# Patient Record
Sex: Male | Born: 1995 | Race: Black or African American | Hispanic: No | Marital: Single | Smoking: Current some day smoker
Health system: Southern US, Community
[De-identification: ages and names within clinical notes are randomized; demographics above are authoritative.]

## PROBLEM LIST (undated history)

## (undated) HISTORY — PX: TYMPANOSTOMY TUBE PLACEMENT: SHX32

---
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
## 2016-10-02 ENCOUNTER — Encounter (HOSPITAL_COMMUNITY): Payer: Self-pay | Admitting: Emergency Medicine

## 2016-10-02 ENCOUNTER — Emergency Department (HOSPITAL_COMMUNITY)
Admission: EM | Admit: 2016-10-02 | Discharge: 2016-10-02 | Disposition: A | Discharge status: Home / Self Care | Payer: BLUE CROSS/BLUE SHIELD | Attending: Emergency Medicine | Admitting: Emergency Medicine

## 2016-10-02 DIAGNOSIS — F17210 Nicotine dependence, cigarettes, uncomplicated: Secondary | ICD-10-CM | POA: Diagnosis not present

## 2016-10-02 DIAGNOSIS — Z79899 Other long term (current) drug therapy: Secondary | ICD-10-CM | POA: Diagnosis not present

## 2016-10-02 DIAGNOSIS — J029 Acute pharyngitis, unspecified: Secondary | ICD-10-CM | POA: Diagnosis not present

## 2016-10-02 LAB — BASIC METABOLIC PANEL
Anion gap: 11 (ref 5–15)
BUN: 11 mg/dL (ref 6–20)
CALCIUM: 9 mg/dL (ref 8.9–10.3)
CHLORIDE: 97 mmol/L — AB (ref 101–111)
CO2: 22 mmol/L (ref 22–32)
CREATININE: 1.16 mg/dL (ref 0.61–1.24)
GFR calc Af Amer: >60 mL/min (ref >60)
GFR calc non Af Amer: >60 mL/min (ref >60)
GLUCOSE: 124 mg/dL — AB (ref 65–99)
Potassium: 3 mmol/L — ABNORMAL LOW (ref 3.5–5.1)
Sodium: 130 mmol/L — ABNORMAL LOW (ref 135–145)

## 2016-10-02 LAB — CBC WITH DIFFERENTIAL/PLATELET
Basophils Absolute: 0 10*3/uL (ref 0.0–0.1)
Basophils Relative: 0 %
Eosinophils Absolute: 0 10*3/uL (ref 0.0–0.7)
Eosinophils Relative: 0 %
HEMATOCRIT: 40.2 % (ref 39.0–52.0)
HEMOGLOBIN: 14.3 g/dL (ref 13.0–17.0)
LYMPHS ABS: 1 10*3/uL (ref 0.7–4.0)
LYMPHS PCT: 9 %
MCH: 31.6 pg (ref 26.0–34.0)
MCHC: 35.6 g/dL (ref 30.0–36.0)
MCV: 88.9 fL (ref 78.0–100.0)
MONO ABS: 1.6 10*3/uL — AB (ref 0.1–1.0)
MONOS PCT: 14 %
NEUTROS ABS: 8.6 10*3/uL — AB (ref 1.7–7.7)
NEUTROS PCT: 77 %
Platelets: 138 10*3/uL — ABNORMAL LOW (ref 150–400)
RBC: 4.52 MIL/uL (ref 4.22–5.81)
RDW: 11.6 % (ref 11.5–15.5)
WBC: 11.2 10*3/uL — ABNORMAL HIGH (ref 4.0–10.5)

## 2016-10-02 LAB — MONONUCLEOSIS SCREEN: Mono Screen: NEGATIVE

## 2016-10-02 LAB — RAPID STREP SCREEN (MED CTR MEBANE ONLY): STREPTOCOCCUS, GROUP A SCREEN (DIRECT): NEGATIVE

## 2016-10-02 MED ORDER — IBUPROFEN 800 MG PO TABS
800.0000 mg | ORAL_TABLET | Freq: Once | ORAL | Status: AC
  Administered 2016-10-02: 800 mg via ORAL
  Filled 2016-10-02: qty 1

## 2016-10-02 MED ORDER — POTASSIUM CHLORIDE CRYS ER 20 MEQ PO TBCR
40.0000 meq | EXTENDED_RELEASE_TABLET | Freq: Once | ORAL | Status: AC
  Administered 2016-10-02: 40 meq via ORAL
  Filled 2016-10-02: qty 2

## 2016-10-02 MED ORDER — SODIUM CHLORIDE 0.9 % IV BOLUS (SEPSIS)
1000.0000 mL | Freq: Once | INTRAVENOUS | Status: DC
Start: 2016-10-02 — End: 2016-10-02

## 2016-10-02 MED ORDER — IBUPROFEN 800 MG PO TABS: 800.0000 mg | ORAL_TABLET | Freq: Three times a day (TID) | ORAL | 0 refills | Status: DC

## 2016-10-02 MED ORDER — ACETAMINOPHEN 500 MG PO TABS
1000.0000 mg | ORAL_TABLET | Freq: Once | ORAL | Status: AC
  Administered 2016-10-02: 1000 mg via ORAL
  Filled 2016-10-02: qty 2

## 2016-10-02 NOTE — Discharge Instructions (Signed)
Your sore throat appears viral at this time,  although a strep culture is pending and you will be notified if this test is positive. Rest and make sure you are drinking plenty of fluids. Continue taking ibuprofen which will help with your throat pain and your fevers.

## 2016-10-02 NOTE — ED Triage Notes (Signed)
Pt c/o sore throat, chills, n/v since Sunday.

## 2016-10-02 NOTE — ED Provider Notes (Signed)
[facility]-EMERGENCY DEPT Provider Note   CSN: [id number] Arrival date & time: 10/02/16  4098     History   Chief Complaint Chief Complaint  Patient presents with  . Sore Throat    HPI Norman Grant is a 20 y.o. male presenting with a 2 day history of sore throat, swollen throat glands,myalgia, nausea and subjective fevers.  He has taken DayQuil, NyQuil and Tylenol with temporary improvement his symptoms.  He also endorses a nonproductive cough.  He denies headache, chest pain, shortness of breath vomiting or abdominal pain.  He is currently a Consulting civil engineer, to his knowledge he has had no contact with strep throat or influenza.  He has been maintaining by mouth fluid intake and denies dizziness or weakness.  His last dose of medication was 2 aspirin tablets around 4 AM today.  The history is provided by the patient.    History reviewed. No pertinent past medical history.  Patient Active Problem List   Diagnosis Date Noted  . Metacarpophalangeal joint sprain 09/16/2012    Past Surgical History:  Procedure Laterality Date  . TYMPANOSTOMY TUBE PLACEMENT         Home Medications    Prior to Admission medications   Medication Sig Start Date End Date Taking? Authorizing Provider  Pseudoeph-Doxylamine-DM-APAP (NYQUIL PO) Take 1 capsule by mouth daily as needed (cold symptoms).   Yes Historical Provider, MD  Pseudoephedrine-APAP-DM (DAYQUIL PO) Take 1 capsule by mouth daily as needed (cold symptoms).   Yes Historical Provider, MD  ibuprofen (ADVIL,MOTRIN) 800 MG tablet Take 1 tablet (800 mg total) by mouth 3 (three) times daily. 10/02/16   Burgess Amor, PA-C    Family History No family history on file.  Social History Social History  Substance Use Topics  . Smoking status: Current Every Day Smoker    Types: Cigars  . Smokeless tobacco: Never Used  . Alcohol use No     Allergies   Review of patient's allergies indicates no known allergies.   Review of Systems Review of Systems   Constitutional: Positive for chills and fever.  HENT: Positive for sore throat. Negative for congestion, ear pain, rhinorrhea, sinus pressure, trouble swallowing and voice change.   Eyes: Negative for discharge.  Respiratory: Positive for cough. Negative for shortness of breath, wheezing and stridor.   Cardiovascular: Negative for chest pain.  Gastrointestinal: Positive for nausea. Negative for abdominal pain and vomiting.  Genitourinary: Negative.   Musculoskeletal: Positive for myalgias.     Physical Exam Updated Vital Signs BP 108/58 (BP Location: Left Arm)   Pulse 104   Temp 100.4 F (38 C) (Oral)   Resp 18   Ht 6\' 1"  (1.854 m)   Wt 90.7 kg   SpO2 99%   BMI 26.39 kg/m   Physical Exam  Constitutional: He is oriented to person, place, and time. He appears well-developed and well-nourished.  HENT:  Head: Normocephalic and atraumatic.  Right Ear: Tympanic membrane and ear canal normal.  Left Ear: Tympanic membrane and ear canal normal.  Nose: No mucosal edema or rhinorrhea.  Mouth/Throat: Uvula is midline and mucous membranes are normal. Posterior oropharyngeal erythema present. No oropharyngeal exudate, posterior oropharyngeal edema or tonsillar abscesses.  Eyes: Conjunctivae are normal.  Cardiovascular: Normal rate and normal heart sounds.   Pulmonary/Chest: Effort normal. No respiratory distress. He has no wheezes. He has no rales.  Abdominal: Soft. He exhibits no mass. There is no tenderness. There is no guarding.  Musculoskeletal: Normal range of motion.  Lymphadenopathy:  Head (right side): Tonsillar and occipital adenopathy present.       Head (left side): Tonsillar and occipital adenopathy present.  Neurological: He is alert and oriented to person, place, and time.  Skin: Skin is warm and dry. No rash noted.  Psychiatric: He has a normal mood and affect.     ED Treatments / Results  Labs (all labs ordered are listed, but only abnormal results are  displayed) Labs Reviewed  CBC WITH DIFFERENTIAL/PLATELET - Abnormal; Notable for the following:       Result Value   WBC 11.2 (*)    Platelets 138 (*)    Neutro Abs 8.6 (*)    Monocytes Absolute 1.6 (*)    All other components within normal limits  BASIC METABOLIC PANEL - Abnormal; Notable for the following:    Sodium 130 (*)    Potassium 3.0 (*)    Chloride 97 (*)    Glucose, Bld 124 (*)    All other components within normal limits  RAPID STREP SCREEN (NOT AT [facility])  CULTURE, GROUP A STREP [facility]  MONONUCLEOSIS SCREEN    EKG  EKG Interpretation None       Radiology No results found.  Procedures Procedures (including critical care time)  Medications Ordered in ED Medications  potassium chloride SA (K-DUR,KLOR-CON) CR tablet 40 mEq (not administered)  ibuprofen (ADVIL,MOTRIN) tablet 800 mg (800 mg Oral Given 10/02/16 0932)  acetaminophen (TYLENOL) tablet 1,000 mg (1,000 mg Oral Given 10/02/16 1050)     Initial Impression / Assessment and Plan / ED Course  I have reviewed the triage vital signs and the nursing notes.  Pertinent labs & imaging results that were available during my care of the patient were reviewed by me and considered in my medical decision making (see chart for details).  Clinical Course    Exam and labs c/w viral process. Pt felt much improved after receiving ibuprofen, fever and throat pain better.  He tolerated PO intake here without problems.  Encouraged continued ibuprofen , rest, increased fluid intake.  Recheck for any persistent or worsened sx.    Final Clinical Impressions(s) / ED Diagnoses   Final diagnoses:  Viral pharyngitis    New Prescriptions New Prescriptions   IBUPROFEN (ADVIL,MOTRIN) 800 MG TABLET    Take 1 tablet (800 mg total) by mouth 3 (three) times daily.     Burgess AmorJulie Deen Deguia, PA-C 10/02/16 1145    Azalia BilisKevin Campos, MD 10/02/16 [phone]

## 2016-10-02 NOTE — ED Notes (Signed)
Ginger ale given. Patient drinking without difficulty.

## 2016-10-04 LAB — CULTURE, GROUP A STREP (THRC)

## 2018-07-25 ENCOUNTER — Telehealth: Payer: Self-pay

## 2018-07-25 NOTE — Telephone Encounter (Signed)
Opened in error

## 2018-08-31 ENCOUNTER — Emergency Department (HOSPITAL_COMMUNITY)
Admission: EM | Admit: 2018-08-31 | Discharge: 2018-08-31 | Disposition: A | Discharge status: Home / Self Care | Payer: Self-pay | Attending: Emergency Medicine | Admitting: Emergency Medicine

## 2018-08-31 ENCOUNTER — Emergency Department (HOSPITAL_COMMUNITY): Payer: Self-pay

## 2018-08-31 ENCOUNTER — Other Ambulatory Visit: Payer: Self-pay

## 2018-08-31 ENCOUNTER — Encounter (HOSPITAL_COMMUNITY): Payer: Self-pay

## 2018-08-31 DIAGNOSIS — B9789 Other viral agents as the cause of diseases classified elsewhere: Secondary | ICD-10-CM | POA: Insufficient documentation

## 2018-08-31 DIAGNOSIS — R0789 Other chest pain: Secondary | ICD-10-CM

## 2018-08-31 DIAGNOSIS — J069 Acute upper respiratory infection, unspecified: Secondary | ICD-10-CM | POA: Insufficient documentation

## 2018-08-31 DIAGNOSIS — F17290 Nicotine dependence, other tobacco product, uncomplicated: Secondary | ICD-10-CM | POA: Insufficient documentation

## 2018-08-31 MED ORDER — PHENOL 1.4 % MT LIQD: 2.0000 | OROMUCOSAL | 0 refills | Status: AC | PRN

## 2018-08-31 MED ORDER — LORATADINE 10 MG PO TABS: 10.0000 mg | ORAL_TABLET | Freq: Every day | ORAL | 0 refills | Status: AC | PRN

## 2018-08-31 MED ORDER — FLUTICASONE PROPIONATE 50 MCG/ACT NA SUSP: 2.0000 | Freq: Every day | NASAL | 0 refills | Status: AC

## 2018-08-31 NOTE — Discharge Instructions (Addendum)
Your x-rays today did not show any sign of pneumonia.  Your symptoms are likely due to a virus.  Drink plenty water and get plenty of rest. Gargle warm salt water and spit it out for sore throat. May also use cough drops, warm teas, Chloraseptic spray over-the-counter, etc. Take flonase to decrease nasal congestion.  Over-the-counter allergy medicines or cold medicine for nasal congestion and scratchy throat. Alternate 600 mg of ibuprofen and [phone] mg of Tylenol every 3 hours as needed for pain. Do not exceed 4000 mg of Tylenol daily.  You can also apply heating pad or take a hot shower to help relax aching muscles.   Followup with your primary care doctor in 5-7 days for recheck of ongoing symptoms. Return to emergency department for emergent changing or worsening of symptoms such as throat tightness, facial swelling, fever not controlled by ibuprofen or Tylenol,difficulty breathing, or chest pain.

## 2018-08-31 NOTE — ED Triage Notes (Signed)
Pt reports sore throat, nasal congestion, cough, and body aches since Saturday morning.

## 2018-08-31 NOTE — ED Provider Notes (Signed)
[facility]-EMERGENCY DEPT Provider Note   CSN: [id number] Arrival date & time: 08/31/18  1634     History   Chief Complaint Chief Complaint  Patient presents with  . Sore Throat  . Nasal Congestion    HPI Norman Grant is a 22 y.o. male presents for evaluation of acute onset, somewhat improving nasal congestion, sore throat, cough, and body aches since Friday evening.  He notes cough productive of yellow sputum.  Denies shortness of breath but does note aching anterior chest wall pain with cough only.  Also notes scratchy throat, denies facial swelling or drooling.  Notes fever at home of 100.4 F.  Also notes generalized myalgias.  He has tried DayQuil with some improvement in his symptoms.  Does not smoke cigarettes but smokes marijuana daily.  The history is provided by the patient.    History reviewed. No pertinent past medical history.  Patient Active Problem List   Diagnosis Date Noted  . Metacarpophalangeal joint sprain 09/16/2012    Past Surgical History:  Procedure Laterality Date  . TYMPANOSTOMY TUBE PLACEMENT          Home Medications    Prior to Admission medications   Medication Sig Start Date End Date Taking? Authorizing Provider  fluticasone (FLONASE) 50 MCG/ACT nasal spray Place 2 sprays into both nostrils daily. 08/31/18   Charlisha Market A, PA-C  loratadine (CLARITIN) 10 MG tablet Take 1 tablet (10 mg total) by mouth daily as needed for rhinitis. 08/31/18   Lakeria Starkman A, PA-C  phenol (CHLORASEPTIC) 1.4 % LIQD Use as directed 2 sprays in the mouth or throat as needed for throat irritation / pain. 08/31/18   Jeanie Sewer, PA-C    Family History History reviewed. No pertinent family history.  Social History Social History   Tobacco Use  . Smoking status: Current Every Day Smoker    Types: Cigars  . Smokeless tobacco: Never Used  Substance Use Topics  . Alcohol use: No  . Drug use: No     Allergies   Patient has no known  allergies.   Review of Systems Review of Systems  Constitutional: Positive for chills and fever.  HENT: Positive for congestion, sinus pressure and sore throat. Negative for drooling and trouble swallowing.   Respiratory: Positive for cough. Negative for shortness of breath.   Cardiovascular: Positive for chest pain (with cough only).     Physical Exam Updated Vital Signs BP 116/90 (BP Location: Left Arm)   Pulse 93   Temp 98.4 F (36.9 C) (Oral)   Resp (!) 22   Ht 6\' 1"  (1.854 m)   Wt 77.1 kg   SpO2 100%   BMI 22.43 kg/m   Physical Exam  Constitutional: He appears well-developed and well-nourished. No distress.  HENT:  Head: Normocephalic and atraumatic.  Right Ear: Tympanic membrane normal.  Left Ear: Tympanic membrane normal.  Nose: Mucosal edema present. No septal deviation. Right sinus exhibits no maxillary sinus tenderness and no frontal sinus tenderness. Left sinus exhibits no maxillary sinus tenderness and no frontal sinus tenderness.  Mouth/Throat: Uvula is midline and mucous membranes are normal. Posterior oropharyngeal erythema present. No oropharyngeal exudate, posterior oropharyngeal edema or tonsillar abscesses. Tonsils are 0 on the right. Tonsils are 0 on the left. No tonsillar exudate.  Tolerating secretions without difficulty.  Eyes: Pupils are equal, round, and reactive to light. Conjunctivae and EOM are normal. Right eye exhibits no discharge. Left eye exhibits no discharge.  Neck: Normal  range of motion. Neck supple. No JVD present. No tracheal deviation present.  Cardiovascular: Normal rate, regular rhythm and normal heart sounds.  Pulmonary/Chest: Effort normal and breath sounds normal. No respiratory distress. He has no wheezes. He has no rhonchi. He has no rales. He exhibits tenderness.  Diffuse tenderness to palpation of the anterior chest wall parasternally bilaterally.  No deformity, crepitus, ecchymosis, or flail segment noted.  Equal rise and fall of  chest, no increased work of breathing, speaking in full sentences without difficulty.  Abdominal: Soft. Bowel sounds are normal. He exhibits no distension. There is no tenderness. There is no guarding.  Musculoskeletal: He exhibits no edema.  Lymphadenopathy:    He has no cervical adenopathy.  Neurological: He is alert.  Skin: Skin is warm and dry. No erythema.  Psychiatric: He has a normal mood and affect. His behavior is normal.  Nursing note and vitals reviewed.    ED Treatments / Results  Labs (all labs ordered are listed, but only abnormal results are displayed) Labs Reviewed - No data to display  EKG None  Radiology Dg Chest 2 View  Result Date: 08/31/2018 CLINICAL DATA:  Patient with sore throat.  Congestion. EXAM: CHEST - 2 VIEW COMPARISON:  None. FINDINGS: Normal cardiac and mediastinal contours. No consolidative pulmonary opacities. No pleural effusion or pneumothorax. IMPRESSION: No acute cardiopulmonary process. Electronically Signed   By: Annia Beltrew  Davis M.D.   On: 08/31/2018 18:43    Procedures Procedures (including critical care time)  Medications Ordered in ED Medications - No data to display   Initial Impression / Assessment and Plan / ED Course  I have reviewed the triage vital signs and the nursing notes.  Pertinent labs & imaging results that were available during my care of the patient were reviewed by me and considered in my medical decision making (see chart for details).     Pt CXR negative for acute infiltrate. Patients symptoms are consistent with URI, likely viral etiology.  He is afebrile, vital signs are stable.  He is nontoxic in appearance.  No fever or meningeal signs to suggest meningitis.  Examination of the oropharynx does not suggest strep pharyngitis.  Discussed that antibiotics are not indicated for viral infections. Pt will be discharged with symptomatic treatment.  Recommend follow-up with PCP if symptoms persist.  Discussed strict ED return  precautions. Pt verbalized understanding of and agreement with plan and is safe for discharge home at this time.   Final Clinical Impressions(s) / ED Diagnoses   Final diagnoses:  Viral URI with cough  Chest wall pain    ED Discharge Orders         Ordered    phenol (CHLORASEPTIC) 1.4 % LIQD  As needed     08/31/18 1849    fluticasone (FLONASE) 50 MCG/ACT nasal spray  Daily     08/31/18 1849    loratadine (CLARITIN) 10 MG tablet  Daily PRN     08/31/18 1849           Jeanie SewerFawze, Evora Schechter A, PA-C 08/31/18 1852    Loren RacerYelverton, David, MD 08/31/18 2220

## 2018-09-10 ENCOUNTER — Encounter (HOSPITAL_COMMUNITY): Payer: Self-pay | Admitting: Emergency Medicine

## 2018-09-10 ENCOUNTER — Other Ambulatory Visit: Payer: Self-pay

## 2018-09-10 ENCOUNTER — Emergency Department (HOSPITAL_COMMUNITY): Payer: Self-pay

## 2018-09-10 ENCOUNTER — Emergency Department (HOSPITAL_COMMUNITY)
Admission: EM | Admit: 2018-09-10 | Discharge: 2018-09-10 | Disposition: A | Discharge status: Home / Self Care | Payer: Self-pay | Attending: Emergency Medicine | Admitting: Emergency Medicine

## 2018-09-10 DIAGNOSIS — Z79899 Other long term (current) drug therapy: Secondary | ICD-10-CM | POA: Insufficient documentation

## 2018-09-10 DIAGNOSIS — F17290 Nicotine dependence, other tobacco product, uncomplicated: Secondary | ICD-10-CM | POA: Insufficient documentation

## 2018-09-10 DIAGNOSIS — R112 Nausea with vomiting, unspecified: Secondary | ICD-10-CM

## 2018-09-10 DIAGNOSIS — J069 Acute upper respiratory infection, unspecified: Secondary | ICD-10-CM

## 2018-09-10 DIAGNOSIS — Z7982 Long term (current) use of aspirin: Secondary | ICD-10-CM | POA: Insufficient documentation

## 2018-09-10 MED ORDER — ALBUTEROL SULFATE HFA 108 (90 BASE) MCG/ACT IN AERS
1.0000 | INHALATION_SPRAY | Freq: Once | RESPIRATORY_TRACT | Status: AC
  Administered 2018-09-10: 1 via RESPIRATORY_TRACT
  Filled 2018-09-10: qty 6.7

## 2018-09-10 MED ORDER — ONDANSETRON 4 MG PO TBDP
4.0000 mg | ORAL_TABLET | Freq: Once | ORAL | Status: AC
  Administered 2018-09-10: 4 mg via ORAL
  Filled 2018-09-10: qty 1

## 2018-09-10 MED ORDER — ONDANSETRON 4 MG PO TBDP: 4.0000 mg | ORAL_TABLET | Freq: Three times a day (TID) | ORAL | 0 refills | Status: AC | PRN

## 2018-09-10 NOTE — Discharge Instructions (Addendum)
Take the medicines I prescribed for you when you were last seen as prescribed.  You can take Zofran as needed for nausea and vomiting.  Wait around 20 minutes before having anything to eat or drink to give this medication time to work.  You can use the albuterol inhaler 1 puff every 4-6 hours as needed for shortness of breath.  Drink plenty of water and get plenty of rest.  Follow-up with a primary care physician if your symptoms persist.  Return to the emergency department if any concerning signs or symptoms develop such as worsening shortness of breath or chest pain, persistent vomiting, high fevers, or abdominal pain.

## 2018-09-10 NOTE — ED Provider Notes (Signed)
[facility]-EMERGENCY DEPT Provider Note   CSN: [id number] Arrival date & time: 09/10/18  0744     History   Chief Complaint Chief Complaint  Patient presents with  . Cough  . Generalized Body Aches    HPI Norman Grant is a 22 y.o. male with no significant past medical history presents today with ongoing complaints of cough for 12 days.  Seen and evaluated in the ED on 08/31/2018 with similar symptoms.  He states that his nasal congestion and sore throat have essentially cleared but he still notes ongoing nonproductive cough and chest wall pain with cough.  He states that more recently in the past 2 to 3 days he will feel short of breath with excessive labor at work which resolves with rest.  He has had 3 episodes of nonbloody nonbilious emesis in the past 3 days with mild nausea.  He states he is in general able to tolerate p.o. food and fluids.  Denies abdominal pain, urinary symptoms, diarrhea, constipation, fevers, or chills.  Has not tried anything for his symptoms because he has not had time to fill the prescriptions that I prescribed for him 10 days ago.  He states he has not been resting and his employer encouraged him to get reevaluated due to his ongoing symptoms.  Denies leg swelling, recent travel or surgeries, hemoptysis, prior history of DVT or PE, or testosterone hormone placement therapy.  The history is provided by the patient.    History reviewed. No pertinent past medical history.  Patient Active Problem List   Diagnosis Date Noted  . Metacarpophalangeal joint sprain 09/16/2012    Past Surgical History:  Procedure Laterality Date  . TYMPANOSTOMY TUBE PLACEMENT          Home Medications    Prior to Admission medications   Medication Sig Start Date End Date Taking? Authorizing Provider  aspirin 325 MG tablet Take 325 mg by mouth daily as needed for mild pain or fever.    Yes [provider]  loratadine (CLARITIN) 10 MG tablet  Take 1 tablet (10 mg total) by mouth daily as needed for rhinitis. 08/31/18  Yes Damieon Armendariz A, PA-C  fluticasone (FLONASE) 50 MCG/ACT nasal spray Place 2 sprays into both nostrils daily. 08/31/18   Luevenia Maxin, Chrystal Zeimet A, PA-C  ondansetron (ZOFRAN ODT) 4 MG disintegrating tablet Take 1 tablet (4 mg total) by mouth every 8 (eight) hours as needed for nausea or vomiting. 09/10/18   Luevenia Maxin, Quintella Mura A, PA-C  phenol (CHLORASEPTIC) 1.4 % LIQD Use as directed 2 sprays in the mouth or throat as needed for throat irritation / pain. 08/31/18   Jeanie Sewer, PA-C    Family History No family history on file.  Social History Social History   Tobacco Use  . Smoking status: Current Every Day Smoker    Types: Cigars  . Smokeless tobacco: Never Used  Substance Use Topics  . Alcohol use: No  . Drug use: No     Allergies   Patient has no known allergies.   Review of Systems Review of Systems  Constitutional: Negative for chills and fever.  Respiratory: Positive for cough and shortness of breath.   Cardiovascular: Positive for chest pain (with cough only).  Gastrointestinal: Positive for nausea and vomiting. Negative for abdominal pain, constipation and diarrhea.  All other systems reviewed and are negative.    Physical Exam Updated Vital Signs BP 131/66 (BP Location: Right Arm)   Pulse 79   Temp  98.2 F (36.8 C) (Oral)   Resp 18   Ht 6\' 1"  (1.854 m)   Wt 78.5 kg   SpO2 100%   BMI 22.82 kg/m   Physical Exam  Constitutional: He appears well-developed and well-nourished. No distress.  HENT:  Head: Normocephalic and atraumatic.  Right Ear: Tympanic membrane normal.  Left Ear: Tympanic membrane normal.  Nose: No mucosal edema, rhinorrhea or septal deviation. Right sinus exhibits no maxillary sinus tenderness and no frontal sinus tenderness. Left sinus exhibits no maxillary sinus tenderness and no frontal sinus tenderness.  Mouth/Throat: Uvula is midline, oropharynx is clear and moist and mucous  membranes are normal. No trismus in the jaw. No tonsillar exudate.  Eyes: Conjunctivae are normal. Right eye exhibits no discharge. Left eye exhibits no discharge.  Neck: No JVD present. No tracheal deviation present.  Cardiovascular: Normal rate, regular rhythm, intact distal pulses and normal pulses.  Pulmonary/Chest: Effort normal and breath sounds normal. No stridor. No respiratory distress. He exhibits no tenderness.  Abdominal: Soft. Bowel sounds are normal. He exhibits no distension. There is no tenderness. There is no guarding.  Musculoskeletal: He exhibits no edema.  Neurological: He is alert.  Skin: Skin is warm and dry. No erythema.  Psychiatric: He has a normal mood and affect. His behavior is normal.  Nursing note and vitals reviewed.    ED Treatments / Results  Labs (all labs ordered are listed, but only abnormal results are displayed) Labs Reviewed - No data to display  EKG None  Radiology Dg Chest 2 View  Result Date: 09/10/2018 CLINICAL DATA:  Cough with chest tightness EXAM: CHEST - 2 VIEW COMPARISON:  August 31, 2018 FINDINGS: Lungs are clear. Heart size and pulmonary vascularity are normal. No adenopathy. No pneumothorax. No bone lesions. IMPRESSION: No edema or consolidation. Electronically Signed   By: Bretta Bang III M.D.   On: 09/10/2018 09:21    Procedures Procedures (including critical care time)  Medications Ordered in ED Medications  ondansetron (ZOFRAN-ODT) disintegrating tablet 4 mg (has no administration in time range)  albuterol (PROVENTIL HFA;VENTOLIN HFA) 108 (90 Base) MCG/ACT inhaler 1 puff (has no administration in time range)     Initial Impression / Assessment and Plan / ED Course  I have reviewed the triage vital signs and the nursing notes.  Pertinent labs & imaging results that were available during my care of the patient were reviewed by me and considered in my medical decision making (see chart for details).     Patient  with ongoing cough, now has intermittent shortness of breath while working and has had intermittent nausea and vomiting.  He is afebrile, vital signs are stable.  Nontoxic in appearance.  Lungs clear to auscultation, no increased work of breathing on exam.  He is PERC negative and symptoms appear more consistent with infectious etiology.  Chest it does not sound cardiac in nature.  Doubt ACS/MI.  Repeat chest x-ray shows no evidence of consolidation or edema.  Suspect bronchitis.  Abdomen is soft and nontender, doubt acute surgical abdominal pathology.  He is tolerating p.o. fluids in the ED without difficulty.  Will discharge with symptomatic management.  Recommend follow-up with PCP if symptoms persist.  Discussed strict ED return precautions. Pt verbalized understanding of and agreement with plan and is safe for discharge home at this time.   Final Clinical Impressions(s) / ED Diagnoses   Final diagnoses:  Viral upper respiratory tract infection  Nausea and vomiting in adult    ED  Discharge Orders         Ordered    ondansetron (ZOFRAN ODT) 4 MG disintegrating tablet  Every 8 hours PRN     09/10/18 0953           Jeanie Sewer, PA-C 09/10/18 9604    Alvira Monday, MD 09/10/18 [phone]

## 2018-09-10 NOTE — ED Triage Notes (Signed)
Patient here from work with complaints of continuing cough, congestion, and generalized body aches.

## 2018-11-02 ENCOUNTER — Emergency Department (HOSPITAL_COMMUNITY)
Admission: EM | Admit: 2018-11-02 | Discharge: 2018-11-02 | Disposition: A | Discharge status: Home / Self Care | Payer: Self-pay | Attending: Emergency Medicine | Admitting: Emergency Medicine

## 2018-11-02 ENCOUNTER — Other Ambulatory Visit: Payer: Self-pay

## 2018-11-02 ENCOUNTER — Encounter (HOSPITAL_COMMUNITY): Payer: Self-pay

## 2018-11-02 DIAGNOSIS — F17210 Nicotine dependence, cigarettes, uncomplicated: Secondary | ICD-10-CM | POA: Insufficient documentation

## 2018-11-02 DIAGNOSIS — R112 Nausea with vomiting, unspecified: Secondary | ICD-10-CM | POA: Insufficient documentation

## 2018-11-02 DIAGNOSIS — R197 Diarrhea, unspecified: Secondary | ICD-10-CM | POA: Insufficient documentation

## 2018-11-02 LAB — COMPREHENSIVE METABOLIC PANEL
ALBUMIN: 4 g/dL (ref 3.5–5.0)
ALT: 17 U/L (ref 0–44)
AST: 25 U/L (ref 15–41)
Alkaline Phosphatase: 74 U/L (ref 38–126)
Anion gap: 7 (ref 5–15)
BUN: 16 mg/dL (ref 6–20)
CHLORIDE: 105 mmol/L (ref 98–111)
CO2: 28 mmol/L (ref 22–32)
CREATININE: 1.38 mg/dL — AB (ref 0.61–1.24)
Calcium: 8.9 mg/dL (ref 8.9–10.3)
GFR calc non Af Amer: >60 mL/min (ref >60)
GLUCOSE: 87 mg/dL (ref 70–99)
Potassium: 4 mmol/L (ref 3.5–5.1)
SODIUM: 140 mmol/L (ref 135–145)
Total Bilirubin: 0.5 mg/dL (ref 0.3–1.2)
Total Protein: 6.8 g/dL (ref 6.5–8.1)

## 2018-11-02 LAB — LIPASE, BLOOD: LIPASE: 37 U/L (ref 11–51)

## 2018-11-02 LAB — URINALYSIS, ROUTINE W REFLEX MICROSCOPIC
Bilirubin Urine: NEGATIVE
GLUCOSE, UA: NEGATIVE mg/dL
Hgb urine dipstick: NEGATIVE
KETONES UR: NEGATIVE mg/dL
LEUKOCYTES UA: NEGATIVE
Nitrite: NEGATIVE
PH: 6 (ref 5.0–8.0)
Protein, ur: NEGATIVE mg/dL
Specific Gravity, Urine: 1.025 (ref 1.005–1.030)

## 2018-11-02 LAB — CBC
HEMATOCRIT: 41.2 % (ref 39.0–52.0)
Hemoglobin: 13.5 g/dL (ref 13.0–17.0)
MCH: 32.1 pg (ref 26.0–34.0)
MCHC: 32.8 g/dL (ref 30.0–36.0)
MCV: 97.9 fL (ref 80.0–100.0)
NRBC: 0 % (ref 0.0–0.2)
Platelets: 160 10*3/uL (ref 150–400)
RBC: 4.21 MIL/uL — ABNORMAL LOW (ref 4.22–5.81)
RDW: 12.4 % (ref 11.5–15.5)
WBC: 5.5 10*3/uL (ref 4.0–10.5)

## 2018-11-02 LAB — MAGNESIUM: Magnesium: 2 mg/dL (ref 1.7–2.4)

## 2018-11-02 MED ORDER — LOPERAMIDE HCL 2 MG PO CAPS
4.0000 mg | ORAL_CAPSULE | Freq: Once | ORAL | Status: AC
  Administered 2018-11-02: 4 mg via ORAL
  Filled 2018-11-02: qty 2

## 2018-11-02 NOTE — ED Provider Notes (Signed)
[facility]-EMERGENCY DEPT Provider Note   CSN: [id number] Arrival date & time: 11/02/18  0809     History   Chief Complaint Chief Complaint  Patient presents with  . Diarrhea    HPI Norman Grant is a 22 y.o. male.  22 yo M with a chief complaint of vomiting and diarrhea.  This started yesterday morning after the patient had 6 hotdogs from a gas station.  Since then it has improved but he still having some loose stools.  Been able to tolerate p.o. without difficulty.  Denies fevers denies dark or bloody stool.  Denies bilious or bloody emesis.  Denies abdominal pain.  The history is provided by the patient.  Diarrhea   This is a new problem. The current episode started 2 days ago. The problem occurs 2 to 4 times per day. The problem has been gradually improving. There has been no fever. Associated symptoms include vomiting. Pertinent negatives include no abdominal pain, no chills, no headaches, no arthralgias and no myalgias. He has tried nothing for the symptoms. The treatment provided no relief. Risk factors include suspect food intake.    History reviewed. No pertinent past medical history.  Patient Active Problem List   Diagnosis Date Noted  . Metacarpophalangeal joint sprain 09/16/2012    Past Surgical History:  Procedure Laterality Date  . TYMPANOSTOMY TUBE PLACEMENT          Home Medications    Prior to Admission medications   Medication Sig Start Date End Date Taking? Authorizing Provider  fluticasone (FLONASE) 50 MCG/ACT nasal spray Place 2 sprays into both nostrils daily. Patient not taking: Reported on 11/02/2018 08/31/18   Michela Pitcher A, PA-C  loratadine (CLARITIN) 10 MG tablet Take 1 tablet (10 mg total) by mouth daily as needed for rhinitis. Patient not taking: Reported on 11/02/2018 08/31/18   Michela Pitcher A, PA-C  ondansetron (ZOFRAN ODT) 4 MG disintegrating tablet Take 1 tablet (4 mg total) by mouth every 8 (eight) hours as needed  for nausea or vomiting. Patient not taking: Reported on 11/02/2018 09/10/18   Michela Pitcher A, PA-C  phenol (CHLORASEPTIC) 1.4 % LIQD Use as directed 2 sprays in the mouth or throat as needed for throat irritation / pain. Patient not taking: Reported on 11/02/2018 08/31/18   Jeanie Sewer, PA-C    Family History No family history on file.  Social History Social History   Tobacco Use  . Smoking status: Current Every Day Smoker    Types: Cigars  . Smokeless tobacco: Never Used  Substance Use Topics  . Alcohol use: No  . Drug use: No     Allergies   Patient has no known allergies.   Review of Systems Review of Systems  Constitutional: Negative for chills and fever.  HENT: Negative for congestion and facial swelling.   Eyes: Negative for discharge and visual disturbance.  Respiratory: Negative for shortness of breath.   Cardiovascular: Negative for chest pain and palpitations.  Gastrointestinal: Positive for diarrhea, nausea and vomiting. Negative for abdominal pain.  Musculoskeletal: Negative for arthralgias and myalgias.  Skin: Negative for color change and rash.  Neurological: Negative for tremors, syncope and headaches.  Psychiatric/Behavioral: Negative for confusion and dysphoric mood.     Physical Exam Updated Vital Signs BP 124/81 (BP Location: Right Arm)   Pulse 67   Temp 98 F (36.7 C) (Oral)   Ht 6\' 1"  (1.854 m)   Wt 81.6 kg   SpO2 100%  BMI 23.75 kg/m   Physical Exam  Constitutional: He is oriented to person, place, and time. He appears well-developed and well-nourished.  HENT:  Head: Normocephalic and atraumatic.  Eyes: Pupils are equal, round, and reactive to light. EOM are normal.  Neck: Normal range of motion. Neck supple. No JVD present.  Cardiovascular: Normal rate and regular rhythm. Exam reveals no gallop and no friction rub.  No murmur heard. Pulmonary/Chest: No respiratory distress. He has no wheezes.  Abdominal: He exhibits no distension  and no mass. There is no tenderness. There is no rebound and no guarding.  Benign abdominal exam  Musculoskeletal: Normal range of motion.  Neurological: He is alert and oriented to person, place, and time.  Skin: No rash noted. No pallor.  Psychiatric: He has a normal mood and affect. His behavior is normal.  Nursing note and vitals reviewed.    ED Treatments / Results  Labs (all labs ordered are listed, but only abnormal results are displayed) Labs Reviewed  COMPREHENSIVE METABOLIC PANEL - Abnormal; Notable for the following components:      Result Value   Creatinine, Ser 1.38 (*)    All other components within normal limits  CBC - Abnormal; Notable for the following components:   RBC 4.21 (*)    All other components within normal limits  LIPASE, BLOOD  URINALYSIS, ROUTINE W REFLEX MICROSCOPIC  MAGNESIUM    EKG None  Radiology No results found.  Procedures Procedures (including critical care time)  Medications Ordered in ED Medications  loperamide (IMODIUM) capsule 4 mg (4 mg Oral Given 11/02/18 0930)     Initial Impression / Assessment and Plan / ED Course  I have reviewed the triage vital signs and the nursing notes.  Pertinent labs & imaging results that were available during my care of the patient were reviewed by me and considered in my medical decision making (see chart for details).     22 yo M with a chief complaint of nausea vomiting diarrhea.  Patient is well-appearing and nontoxic.  Has no noted abdominal tenderness.  I suspect this most likely is viral in etiology.  We will give a dose of Imodium.  Check electrolytes.  Potassium, magnesium unremarkable.  Tolerating PO.  D/c home.   9:49 AM:  I have discussed the diagnosis/risks/treatment options with the patient and family and believe the pt to be eligible for discharge home to follow-up with PCP. We also discussed returning to the ED immediately if new or worsening sx occur. We discussed the sx which  are most concerning (e.g., sudden worsening pain, fever, inability to tolerate by mouth) that necessitate immediate return. Medications administered to the patient during their visit and any new prescriptions provided to the patient are listed below.  Medications given during this visit Medications  loperamide (IMODIUM) capsule 4 mg (4 mg Oral Given 11/02/18 0930)     The patient appears reasonably screen and/or stabilized for discharge and I doubt any other medical condition or other [facility] requiring further screening, evaluation, or treatment in the ED at this time prior to discharge.    Final Clinical Impressions(s) / ED Diagnoses   Final diagnoses:  Nausea vomiting and diarrhea    ED Discharge Orders    None       Melene PlanFloyd, Aaronjames Kelsay, DO 11/02/18 [id number]

## 2018-11-02 NOTE — ED Triage Notes (Signed)
He states he has had several diarrhea stools since Friday, at which time he tells us he "ate 5 hot dogs from [employer] He is in no distress. Labs drawn at triage.

## 2018-11-02 NOTE — Discharge Instructions (Signed)
Try imodium for diarrhea.

## 2018-12-02 ENCOUNTER — Other Ambulatory Visit: Payer: Self-pay

## 2018-12-02 ENCOUNTER — Emergency Department (HOSPITAL_COMMUNITY)
Admission: EM | Admit: 2018-12-02 | Discharge: 2018-12-02 | Disposition: A | Discharge status: Home / Self Care | Payer: Self-pay | Attending: Emergency Medicine | Admitting: Emergency Medicine

## 2018-12-02 ENCOUNTER — Encounter (HOSPITAL_COMMUNITY): Payer: Self-pay | Admitting: Emergency Medicine

## 2018-12-02 DIAGNOSIS — Y929 Unspecified place or not applicable: Secondary | ICD-10-CM | POA: Insufficient documentation

## 2018-12-02 DIAGNOSIS — S9031XA Contusion of right foot, initial encounter: Secondary | ICD-10-CM | POA: Insufficient documentation

## 2018-12-02 DIAGNOSIS — S9030XA Contusion of unspecified foot, initial encounter: Secondary | ICD-10-CM

## 2018-12-02 DIAGNOSIS — X58XXXA Exposure to other specified factors, initial encounter: Secondary | ICD-10-CM | POA: Insufficient documentation

## 2018-12-02 DIAGNOSIS — F17290 Nicotine dependence, other tobacco product, uncomplicated: Secondary | ICD-10-CM | POA: Insufficient documentation

## 2018-12-02 DIAGNOSIS — Y9389 Activity, other specified: Secondary | ICD-10-CM | POA: Insufficient documentation

## 2018-12-02 DIAGNOSIS — S9032XA Contusion of left foot, initial encounter: Secondary | ICD-10-CM | POA: Insufficient documentation

## 2018-12-02 DIAGNOSIS — Y998 Other external cause status: Secondary | ICD-10-CM | POA: Insufficient documentation

## 2018-12-02 NOTE — ED Provider Notes (Signed)
[facility] EMERGENCY DEPARTMENT Provider Note   CSN: [id number] Arrival date & time: 12/02/18  [id number]     History   Chief Complaint Chief Complaint  Patient presents with  . Foot Pain    HPI Quitman LivingsCary D Grant is a 22 y.o. male.  Patient is a 22 year old male who presents to the emergency department with a complaint of injury to both feet.  The patient states that he recently moved.  He returned to this area for work.  He left issues, and wore someone else's shoes that were 2 sizes too small.  He worked through his shift, but developed bruises and contusions to both feet.  Today he has pain and swelling.  He has pain with walking, and was unable to complete his duties at his job.  He presents now for evaluation of this injury.  The history is provided by the patient.  Foot Pain  Pertinent negatives include no chest pain, no abdominal pain and no shortness of breath.    History reviewed. No pertinent past medical history.  Patient Active Problem List   Diagnosis Date Noted  . Metacarpophalangeal joint sprain 09/16/2012    Past Surgical History:  Procedure Laterality Date  . TYMPANOSTOMY TUBE PLACEMENT          Home Medications    Prior to Admission medications   Medication Sig Start Date End Date Taking? Authorizing Provider  fluticasone (FLONASE) 50 MCG/ACT nasal spray Place 2 sprays into both nostrils daily. Patient not taking: Reported on 11/02/2018 08/31/18   Michela PitcherFawze, Mina A, PA-C  loratadine (CLARITIN) 10 MG tablet Take 1 tablet (10 mg total) by mouth daily as needed for rhinitis. Patient not taking: Reported on 11/02/2018 08/31/18   Michela PitcherFawze, Mina A, PA-C  ondansetron (ZOFRAN ODT) 4 MG disintegrating tablet Take 1 tablet (4 mg total) by mouth every 8 (eight) hours as needed for nausea or vomiting. Patient not taking: Reported on 11/02/2018 09/10/18   Michela PitcherFawze, Mina A, PA-C  phenol (CHLORASEPTIC) 1.4 % LIQD Use as directed 2 sprays in the mouth or throat as needed for throat  irritation / pain. Patient not taking: Reported on 11/02/2018 08/31/18   Jeanie SewerFawze, Mina A, PA-C    Family History No family history on file.  Social History Social History   Tobacco Use  . Smoking status: Current Every Day Smoker    Types: Cigars  . Smokeless tobacco: Never Used  Substance Use Topics  . Alcohol use: No  . Drug use: No     Allergies   Patient has no known allergies.   Review of Systems Review of Systems  Constitutional: Negative for activity change.       All ROS Neg except as noted in HPI  HENT: Negative for nosebleeds.   Eyes: Negative for photophobia and discharge.  Respiratory: Negative for cough, shortness of breath and wheezing.   Cardiovascular: Negative for chest pain and palpitations.  Gastrointestinal: Negative for abdominal pain and blood in stool.  Genitourinary: Negative for dysuria, frequency and hematuria.  Musculoskeletal: Negative for arthralgias, back pain and neck pain.       Foot pain  Skin: Negative.   Neurological: Negative for dizziness, seizures and speech difficulty.  Psychiatric/Behavioral: Negative for confusion and hallucinations.     Physical Exam Updated Vital Signs BP 122/66 (BP Location: Right Arm)   Pulse 72   Temp 97.8 F (36.6 C) (Temporal)   Resp 16   Ht 6\' 1"  (1.854 m)   Wt 81.6 kg  SpO2 99%   BMI 23.75 kg/m   Physical Exam Vitals signs and nursing note reviewed.  Constitutional:      Appearance: He is well-developed. He is not toxic-appearing.  HENT:     Head: Normocephalic.     Right Ear: Tympanic membrane and external ear normal.     Left Ear: Tympanic membrane and external ear normal.  Eyes:     General: Lids are normal.     Pupils: Pupils are equal, round, and reactive to light.  Neck:     Musculoskeletal: Normal range of motion and neck supple.     Vascular: No carotid bruit.  Cardiovascular:     Rate and Rhythm: Normal rate and regular rhythm.     Pulses: Normal pulses.     Heart sounds:  Normal heart sounds.  Pulmonary:     Effort: No respiratory distress.     Breath sounds: Normal breath sounds.  Abdominal:     General: Bowel sounds are normal.     Palpations: Abdomen is soft.     Tenderness: There is no abdominal tenderness. There is no guarding.  Musculoskeletal: Normal range of motion.        General: Swelling and tenderness present.     Comments: There are bruises at the medial and lateral midfoot as well as the heel on the right and left foot.  The dorsalis pedis pulses 2+.  There is no bony deformity appreciated.  Lymphadenopathy:     Head:     Right side of head: No submandibular adenopathy.     Left side of head: No submandibular adenopathy.     Cervical: No cervical adenopathy.  Skin:    General: Skin is warm and dry.  Neurological:     Mental Status: He is alert and oriented to person, place, and time.     Cranial Nerves: No cranial nerve deficit.     Sensory: No sensory deficit.  Psychiatric:        Speech: Speech normal.      ED Treatments / Results  Labs (all labs ordered are listed, but only abnormal results are displayed) Labs Reviewed - No data to display  EKG None  Radiology No results found.  Procedures Procedures (including critical care time)  Medications Ordered in ED Medications - No data to display   Initial Impression / Assessment and Plan / ED Course  I have reviewed the triage vital signs and the nursing notes.  Pertinent labs & imaging results that were available during my care of the patient were reviewed by me and considered in my medical decision making (see chart for details).       Final Clinical Impressions(s) / ED Diagnoses MDM  Vital signs within normal limits.  Patient has bruising and contusion onto multiple areas of the feet as a result of wearing the wrong size shoes.  There are no neurovascular deficits appreciated.  I have asked the patient to use a ice bath to the feet.  He is asked to use Tylenol  every 4 hours or ibuprofen every 6 hours for discomfort.  Patient to see podiatry for additional evaluation if not improving.   Final diagnoses:  Contusion of foot, unspecified laterality, initial encounter    ED Discharge Orders    None       Ivery Quale, Cordelia Poche 12/02/18 Thayer Dallas, MD 12/02/18 [id number]

## 2018-12-02 NOTE — Discharge Instructions (Signed)
Please use ice baths daily.  Use Tylenol every 4 hours or ibuprofen every 6 hours.  Please refrain from using ill fitting shoes.  Please see the podiatry specialist listed on your discharge instructions if not improving.
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
## 2018-12-12 ENCOUNTER — Encounter (HOSPITAL_COMMUNITY): Payer: Self-pay | Admitting: Emergency Medicine

## 2018-12-12 ENCOUNTER — Emergency Department (HOSPITAL_COMMUNITY)
Admission: EM | Admit: 2018-12-12 | Discharge: 2018-12-12 | Disposition: A | Discharge status: Home / Self Care | Payer: Self-pay | Attending: Emergency Medicine | Admitting: Emergency Medicine

## 2018-12-12 ENCOUNTER — Other Ambulatory Visit: Payer: Self-pay

## 2018-12-12 DIAGNOSIS — S46911A Strain of unspecified muscle, fascia and tendon at shoulder and upper arm level, right arm, initial encounter: Secondary | ICD-10-CM

## 2018-12-12 DIAGNOSIS — Y9389 Activity, other specified: Secondary | ICD-10-CM | POA: Insufficient documentation

## 2018-12-12 DIAGNOSIS — Y999 Unspecified external cause status: Secondary | ICD-10-CM | POA: Insufficient documentation

## 2018-12-12 DIAGNOSIS — Y9289 Other specified places as the place of occurrence of the external cause: Secondary | ICD-10-CM | POA: Insufficient documentation

## 2018-12-12 DIAGNOSIS — M25572 Pain in left ankle and joints of left foot: Secondary | ICD-10-CM

## 2018-12-12 DIAGNOSIS — F17290 Nicotine dependence, other tobacco product, uncomplicated: Secondary | ICD-10-CM | POA: Insufficient documentation

## 2018-12-12 MED ORDER — CYCLOBENZAPRINE HCL 5 MG PO TABS: 5.0000 mg | ORAL_TABLET | Freq: Three times a day (TID) | ORAL | 0 refills | Status: DC | PRN

## 2018-12-12 MED ORDER — IBUPROFEN 800 MG PO TABS
800.0000 mg | ORAL_TABLET | Freq: Once | ORAL | Status: AC
  Administered 2018-12-12: 800 mg via ORAL
  Filled 2018-12-12: qty 1

## 2018-12-12 MED ORDER — IBUPROFEN 800 MG PO TABS: 800.0000 mg | ORAL_TABLET | Freq: Three times a day (TID) | ORAL | 0 refills | Status: AC

## 2018-12-12 MED ORDER — CYCLOBENZAPRINE HCL 5 MG PO TABS: 5.0000 mg | ORAL_TABLET | Freq: Three times a day (TID) | ORAL | 0 refills | Status: AC | PRN
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
## 2019-01-06 ENCOUNTER — Emergency Department (HOSPITAL_COMMUNITY)
Admission: EM | Admit: 2019-01-06 | Discharge: 2019-01-06 | Disposition: A | Discharge status: Home / Self Care | Payer: Self-pay | Attending: Emergency Medicine | Admitting: Emergency Medicine

## 2019-01-06 ENCOUNTER — Other Ambulatory Visit: Payer: Self-pay

## 2019-01-06 ENCOUNTER — Encounter (HOSPITAL_COMMUNITY): Payer: Self-pay | Admitting: Emergency Medicine

## 2019-01-06 DIAGNOSIS — Z0279 Encounter for issue of other medical certificate: Secondary | ICD-10-CM | POA: Insufficient documentation

## 2019-01-06 DIAGNOSIS — F17290 Nicotine dependence, other tobacco product, uncomplicated: Secondary | ICD-10-CM | POA: Insufficient documentation

## 2019-01-06 DIAGNOSIS — M25572 Pain in left ankle and joints of left foot: Secondary | ICD-10-CM | POA: Insufficient documentation

## 2019-01-06 NOTE — ED Triage Notes (Signed)
Pt was seen on 12/12/18.  Pt was given a work note stating light duty  His work needs a work note stating he is okay to return off of light duty. Denies any pain with ankle.

## 2019-01-06 NOTE — Discharge Instructions (Addendum)
I advise you continue using your ankle brace as needed for prolonged standing or activity that worsens your pain. Ankle injuries, especially sprains can take 1 month or more to heal completely.  It is safe to return to work, but I recommend you wearing the brace.  Call Dr. Romeo Apple for any persistent symptoms.

## 2019-01-08 NOTE — ED Provider Notes (Signed)
[facility] EMERGENCY DEPARTMENT Provider Note   CSN: [id number] Arrival date & time: 01/06/19  1400     History   Chief Complaint Chief Complaint  Patient presents with  . Follow-up    HPI Norman Grant is a 23 y.o. male who was seen here by me on 12/12/2018 and diagnosed with a left ankle sprain.  He was given a work note for light duty x1 week with PRN follow-up with orthopedist if needed for any residual symptoms.  The patient states that his employer was unwilling to let him to return to work until he had a note stating he could return to full duty.  The patient currently denies any symptoms of pain in the ankle.  And feels he can return to his full job duties.  The history is provided by the patient.    History reviewed. No pertinent past medical history.  Patient Active Problem List   Diagnosis Date Noted  . Metacarpophalangeal joint sprain 09/16/2012    Past Surgical History:  Procedure Laterality Date  . TYMPANOSTOMY TUBE PLACEMENT          Home Medications    Prior to Admission medications   Medication Sig Start Date End Date Taking? Authorizing Provider  cyclobenzaprine (FLEXERIL) 5 MG tablet Take 1 tablet (5 mg total) by mouth 3 (three) times daily as needed for muscle spasms. 12/12/18   Burgess Amor, PA-C  fluticasone (FLONASE) 50 MCG/ACT nasal spray Place 2 sprays into both nostrils daily. Patient not taking: Reported on 11/02/2018 08/31/18   Michela Pitcher A, PA-C  ibuprofen (ADVIL,MOTRIN) 800 MG tablet Take 1 tablet (800 mg total) by mouth 3 (three) times daily. 12/12/18   Burgess Amor, PA-C  loratadine (CLARITIN) 10 MG tablet Take 1 tablet (10 mg total) by mouth daily as needed for rhinitis. Patient not taking: Reported on 11/02/2018 08/31/18   Michela Pitcher A, PA-C  ondansetron (ZOFRAN ODT) 4 MG disintegrating tablet Take 1 tablet (4 mg total) by mouth every 8 (eight) hours as needed for nausea or vomiting. Patient not taking: Reported on 11/02/2018 09/10/18    Michela Pitcher A, PA-C  phenol (CHLORASEPTIC) 1.4 % LIQD Use as directed 2 sprays in the mouth or throat as needed for throat irritation / pain. Patient not taking: Reported on 11/02/2018 08/31/18   Jeanie Sewer, PA-C    Family History History reviewed. No pertinent family history.  Social History Social History   Tobacco Use  . Smoking status: Current Some Day Smoker    Types: Cigars  . Smokeless tobacco: Never Used  Substance Use Topics  . Alcohol use: No  . Drug use: No     Allergies   Patient has no known allergies.   Review of Systems Review of Systems  Musculoskeletal: Negative for arthralgias and joint swelling.  Skin: Negative for wound.  Neurological: Negative for weakness and numbness.     Physical Exam Updated Vital Signs BP 121/78 (BP Location: Right Arm)   Pulse 89   Temp 98.3 F (36.8 C) (Oral)   Resp 16   Ht 6\' 1"  (1.854 m)   Wt 83.9 kg   SpO2 100%   BMI 24.41 kg/m   Physical Exam Vitals signs and nursing note reviewed.  Constitutional:      Appearance: He is well-developed.  HENT:     Head: Normocephalic.  Cardiovascular:     Rate and Rhythm: Normal rate.     Pulses: No decreased pulses.  Dorsalis pedis pulses are 2+ on the right side and 2+ on the left side.       Posterior tibial pulses are 2+ on the right side and 2+ on the left side.  Musculoskeletal:        General: No swelling, tenderness or deformity.     Right ankle: Normal.     Left ankle: Normal. He exhibits normal range of motion, no swelling and normal pulse. No tenderness. No head of 5th metatarsal and no proximal fibula tenderness found. Achilles tendon normal.  Skin:    General: Skin is warm and dry.  Neurological:     Mental Status: He is alert.     Sensory: No sensory deficit.      ED Treatments / Results  Labs (all labs ordered are listed, but only abnormal results are displayed) Labs Reviewed - No data to display  EKG None  Radiology No results  found.  Procedures Procedures (including critical care time)  Medications Ordered in ED Medications - No data to display   Initial Impression / Assessment and Plan / ED Course  I have reviewed the triage vital signs and the nursing notes.  Pertinent labs & imaging results that were available during my care of the patient were reviewed by me and considered in my medical decision making (see chart for details).     Patient does not have a primary doctor nor does he currently have medical insurance and therefore was having difficulty obtaining follow-up care hence his return here.  He has a completely normal ankle exam.  He works in a [employer] and states he has no symptoms in the ankle.  No indication for this patient to remain out of work.  He was given a work note and hopefully this will suffice for his return. Final Clinical Impressions(s) / ED Diagnoses   Final diagnoses:  Left ankle pain, unspecified chronicity    ED Discharge Orders    None       Victoriano Lain 01/08/19 1247    Eber Hong, MD 01/09/19 1121

## 2019-04-11 IMAGING — CR DG CHEST 2V
2 series · 2 of 2 positions shown · non-contrast
Comparison: None.

CLINICAL DATA: Patient with sore throat.  Congestion.

EXAM:
CHEST - 2 VIEW

[w chest pa]
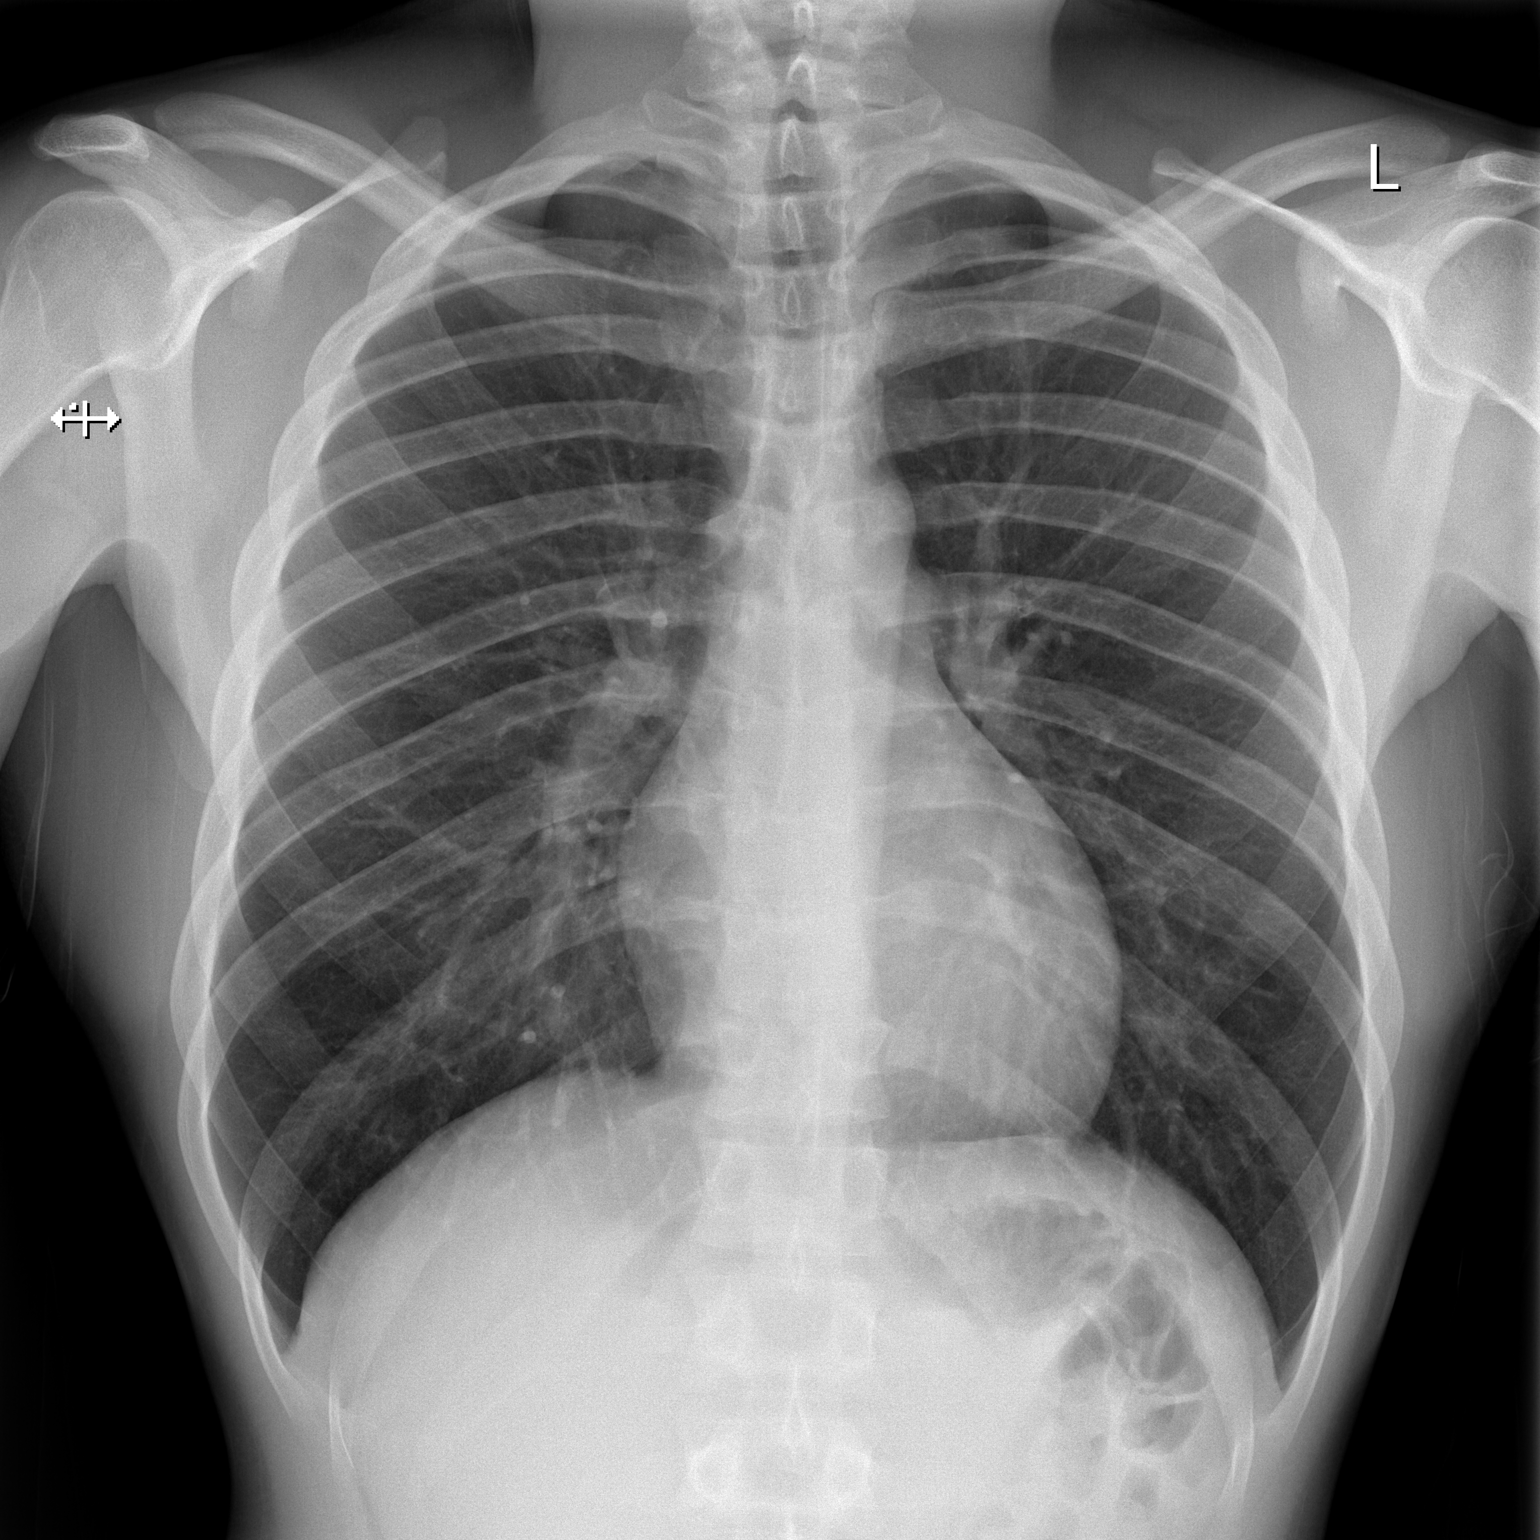

[w chest lat]
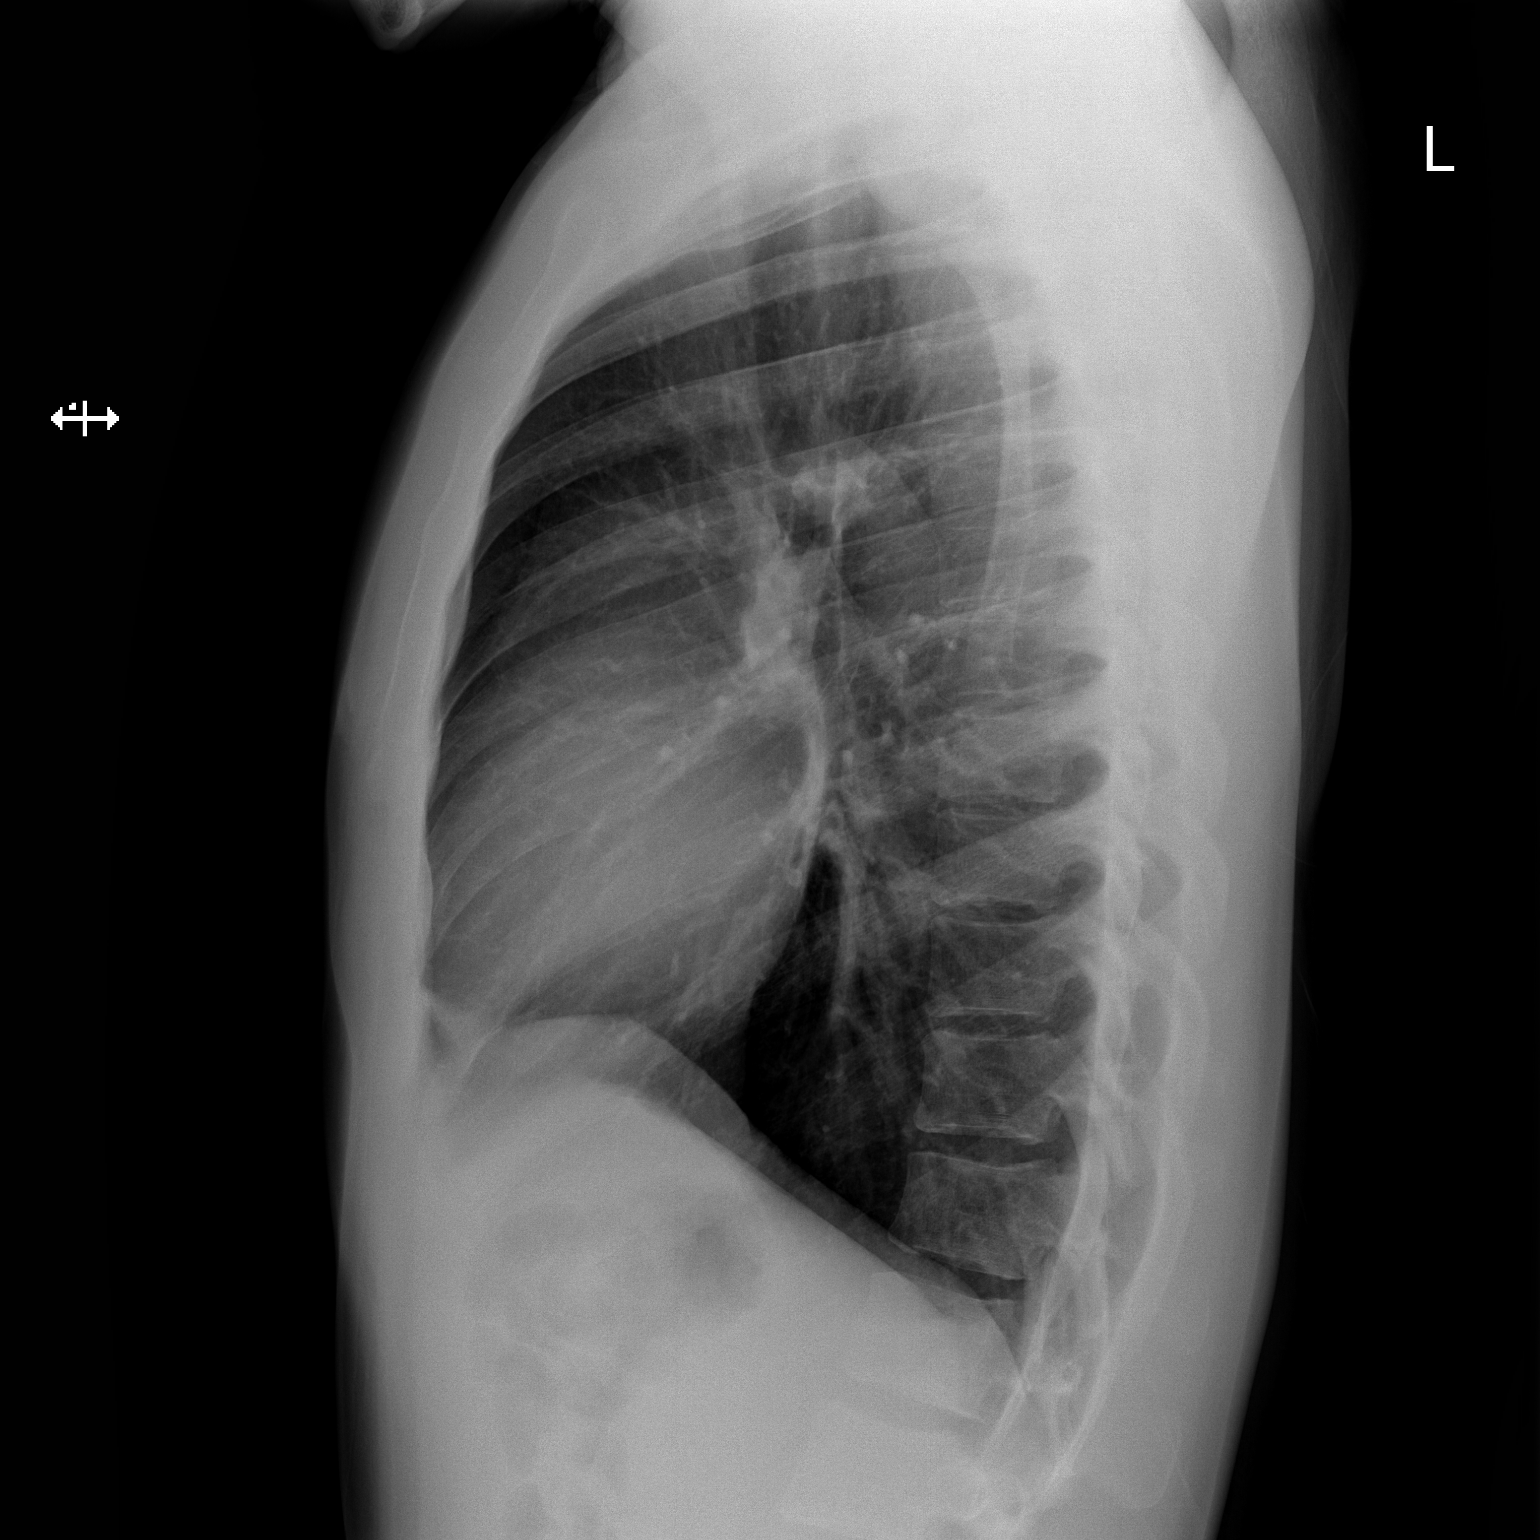

[2 of 2 positions shown; findings below may reference images not displayed]

FINDINGS: Normal cardiac and mediastinal contours. No consolidative pulmonary
opacities. No pleural effusion or pneumothorax.
IMPRESSION: No acute cardiopulmonary process.

## 2024-07-01 ENCOUNTER — Telehealth: Payer: Self-pay | Admitting: General Practice

## 2024-07-01 NOTE — Telephone Encounter (Signed)
 Copied from CRM [id number]. Topic: General - Other >> Jul 01, 2024 10:17 AM Delon DASEN wrote: Reason for CRM: Karna with [employer] [phone]- email - [email or web address]  fax- [phone]  please send verification that this person is not a patient at the office and was not seen on June 25, 2024- he provided them with a work note showing Dr. Rollene Pesa as the provider that saw him to clear him to go back to work

## 2024-07-01 NOTE — Telephone Encounter (Signed)
 Letter emailed
